# Patient Record
Sex: Male | Born: 1980 | Race: Black or African American | Hispanic: No | Marital: Single | Smoking: Former smoker
Health system: Southern US, Community
[De-identification: ages and names within clinical notes are randomized; demographics above are authoritative.]

## PROBLEM LIST (undated history)

## (undated) ENCOUNTER — Emergency Department: Payer: No Typology Code available for payment source | Source: Home / Self Care

---

## 2011-06-17 ENCOUNTER — Emergency Department (HOSPITAL_COMMUNITY): Payer: PRIVATE HEALTH INSURANCE

## 2011-06-17 ENCOUNTER — Emergency Department (HOSPITAL_COMMUNITY)
Admission: EM | Admit: 2011-06-17 | Discharge: 2011-06-17 | Disposition: A | Discharge status: Home / Self Care | Payer: PRIVATE HEALTH INSURANCE | Attending: Emergency Medicine | Admitting: Emergency Medicine

## 2011-06-17 DIAGNOSIS — S060X9A Concussion with loss of consciousness of unspecified duration, initial encounter: Secondary | ICD-10-CM | POA: Insufficient documentation

## 2011-06-17 DIAGNOSIS — M25569 Pain in unspecified knee: Secondary | ICD-10-CM | POA: Insufficient documentation

## 2011-06-17 DIAGNOSIS — R0789 Other chest pain: Secondary | ICD-10-CM | POA: Insufficient documentation

## 2011-06-17 DIAGNOSIS — IMO0002 Reserved for concepts with insufficient information to code with codable children: Secondary | ICD-10-CM | POA: Insufficient documentation

## 2011-06-17 DIAGNOSIS — M542 Cervicalgia: Secondary | ICD-10-CM | POA: Insufficient documentation

## 2011-06-17 LAB — CBC
Hemoglobin: 15.6 g/dL (ref 13.0–17.0)
Platelets: 254 10*3/uL (ref 150–400)
RBC: 4.84 MIL/uL (ref 4.22–5.81)
WBC: 3 10*3/uL — ABNORMAL LOW (ref 4.0–10.5)

## 2011-06-17 LAB — COMPREHENSIVE METABOLIC PANEL
ALT: 12 U/L (ref 0–53)
AST: 28 U/L (ref 0–37)
Alkaline Phosphatase: 81 U/L (ref 39–117)
CO2: 22 mEq/L (ref 19–32)
Calcium: 9.4 mg/dL (ref 8.4–10.5)
Chloride: 104 mEq/L (ref 96–112)
GFR calc Af Amer: >90 mL/min (ref >90)
GFR calc non Af Amer: >90 mL/min (ref >90)
Glucose, Bld: 104 mg/dL — ABNORMAL HIGH (ref 70–99)
Potassium: 3.3 mEq/L — ABNORMAL LOW (ref 3.5–5.1)
Sodium: 139 mEq/L (ref 135–145)

## 2011-06-17 LAB — DIFFERENTIAL
Basophils Absolute: 0 10*3/uL (ref 0.0–0.1)
Eosinophils Absolute: 0.2 10*3/uL (ref 0.0–0.7)
Lymphocytes Relative: 55 % — ABNORMAL HIGH (ref 12–46)
Monocytes Relative: 13 % — ABNORMAL HIGH (ref 3–12)
Neutro Abs: 0.8 10*3/uL — ABNORMAL LOW (ref 1.7–7.7)
Neutrophils Relative %: 26 % — ABNORMAL LOW (ref 43–77)

## 2012-04-28 ENCOUNTER — Encounter (HOSPITAL_COMMUNITY): Payer: Self-pay | Admitting: Adult Health

## 2012-04-28 ENCOUNTER — Emergency Department (HOSPITAL_COMMUNITY)
Admission: EM | Admit: 2012-04-28 | Discharge: 2012-04-28 | Disposition: A | Discharge status: Home / Self Care | Payer: PRIVATE HEALTH INSURANCE | Attending: Emergency Medicine | Admitting: Emergency Medicine

## 2012-04-28 ENCOUNTER — Emergency Department (HOSPITAL_COMMUNITY): Payer: PRIVATE HEALTH INSURANCE

## 2012-04-28 DIAGNOSIS — F17200 Nicotine dependence, unspecified, uncomplicated: Secondary | ICD-10-CM | POA: Insufficient documentation

## 2012-04-28 DIAGNOSIS — S0180XA Unspecified open wound of other part of head, initial encounter: Secondary | ICD-10-CM | POA: Insufficient documentation

## 2012-04-28 DIAGNOSIS — S0083XA Contusion of other part of head, initial encounter: Secondary | ICD-10-CM

## 2012-04-28 DIAGNOSIS — S0181XA Laceration without foreign body of other part of head, initial encounter: Secondary | ICD-10-CM

## 2012-04-28 MED ORDER — NAPROXEN 500 MG PO TABS: 500.0000 mg | ORAL_TABLET | Freq: Two times a day (BID) | ORAL | Status: AC
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
## 2012-04-28 NOTE — ED Notes (Signed)
Pt came to RN first desk and reports he wanted to go; I advised pt of risks of leaving and benefits of staying; pt reports he will stay a little bit longer
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
## 2014-10-03 ENCOUNTER — Ambulatory Visit (INDEPENDENT_AMBULATORY_CARE_PROVIDER_SITE_OTHER): Payer: 59 | Admitting: Family Medicine

## 2014-10-03 VITALS — BP 128/76 | HR 75 | Temp 99.1°F | Resp 16 | Ht 74.0 in | Wt 204.0 lb

## 2014-10-03 DIAGNOSIS — R0789 Other chest pain: Secondary | ICD-10-CM

## 2014-10-03 DIAGNOSIS — A630 Anogenital (venereal) warts: Secondary | ICD-10-CM

## 2014-10-03 DIAGNOSIS — B079 Viral wart, unspecified: Secondary | ICD-10-CM

## 2014-10-03 DIAGNOSIS — Z202 Contact with and (suspected) exposure to infections with a predominantly sexual mode of transmission: Secondary | ICD-10-CM

## 2014-10-03 DIAGNOSIS — Z0000 Encounter for general adult medical examination without abnormal findings: Secondary | ICD-10-CM

## 2014-10-03 DIAGNOSIS — Z8042 Family history of malignant neoplasm of prostate: Secondary | ICD-10-CM

## 2014-10-03 LAB — POCT CBC
Granulocyte percent: 46.7 %G (ref 37–80)
HCT, POC: 48.7 % (ref 43.5–53.7)
HEMOGLOBIN: 16.1 g/dL (ref 14.1–18.1)
Lymph, poc: 1.5 (ref 0.6–3.4)
MCH: 31.3 pg — AB (ref 27–31.2)
MCHC: 33 g/dL (ref 31.8–35.4)
MCV: 94.9 fL (ref 80–97)
MID (CBC): 0.2 (ref 0–0.9)
MPV: 8.5 fL (ref 0–99.8)
PLATELET COUNT, POC: 285 10*3/uL (ref 142–424)
POC Granulocyte: 1.4 — AB (ref 2–6.9)
POC LYMPH PERCENT: 48.2 %L (ref 10–50)
POC MID %: 5.1 % (ref 0–12)
RBC: 5.13 M/uL (ref 4.69–6.13)
RDW, POC: 14.4 %
WBC: 3.1 10*3/uL — AB (ref 4.6–10.2)

## 2014-10-03 LAB — COMPREHENSIVE METABOLIC PANEL
ALBUMIN: 4.2 g/dL (ref 3.5–5.2)
ALK PHOS: 67 U/L (ref 39–117)
ALT: 23 U/L (ref 0–53)
AST: 20 U/L (ref 0–37)
BILIRUBIN TOTAL: 0.9 mg/dL (ref 0.2–1.2)
BUN: 16 mg/dL (ref 6–23)
CO2: 27 meq/L (ref 19–32)
Calcium: 9.2 mg/dL (ref 8.4–10.5)
Chloride: 102 mEq/L (ref 96–112)
Creat: 0.98 mg/dL (ref 0.50–1.35)
GLUCOSE: 89 mg/dL (ref 70–99)
POTASSIUM: 4.2 meq/L (ref 3.5–5.3)
Sodium: 138 mEq/L (ref 135–145)
TOTAL PROTEIN: 7.4 g/dL (ref 6.0–8.3)

## 2014-10-03 LAB — LIPID PANEL
CHOLESTEROL: 159 mg/dL (ref 0–200)
HDL: 45 mg/dL (ref >39)
LDL CALC: 95 mg/dL (ref 0–99)
Total CHOL/HDL Ratio: 3.5 Ratio
Triglycerides: 94 mg/dL (ref <150)
VLDL: 19 mg/dL (ref 0–40)

## 2014-10-03 LAB — HIV ANTIBODY (ROUTINE TESTING W REFLEX): HIV: NONREACTIVE

## 2014-10-03 LAB — RPR

## 2014-10-03 NOTE — Progress Notes (Signed)
Physical exam:  History: 34 year old man who is here for his physical examination. He has never had a good checkup, and felt like it was time to do so. He is otherwise not having any major acute complaints.  Past medical history: Operations: None Medical illnesses: None. He did have malaria when he was a child in [place]. Medications: None Allergies: None known  Family history: Father is deceased from prostate cancer in his late 3150s. Mother is living with diabetes and hyperlipidemia  Social history: Patient is single, has been in the [place] for 7 or 8 years. He was last back in [place] 3 years ago. He is planning to go back and get married in one year. He does not smoke any longer, having quit a month ago. He also quit drinking. He does still dip snuff. 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  Review of systems: Constitutional: Unremarkable Dermatologic: Has genital warts, perianal warts, and a wart on his left shin. He also has an eczematoid rash on his abdomen. HEENT: Has had some nasal polypoid lesions Respiratory: Unremarkable Cardiovascular: Occasional substernal chest pains, nonspecific Gastrointestinal: Unremarkable except for occasional hemorrhoid Genitourinary: Unremarkable except for the genital warts as noted above Musculoskeletal mild left shoulder pains Neurologic: Unremarkable Psychiatric: Unremarkable Endocrine: Unremarkable  Physical examination Healthy-appearing young man in no acute distress. TMs normal. Eyes PERRLA. Fundi benign. Throat clear. Teeth stained. Neck supple without significant nodes. Chest is clear to auscultation. No thyromegaly Heart regular without murmurs gallops or arrhythmias. Abdomen soft without masses tenderness. Normal male external genitalia. Has some old scarred warts on his scrotum. Extremities unremarkable. Skin has a fairly prominent wart on his left side of his beard on the chin. 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 Pulses good.  Assessment: Physical examination Nonspecific chest pains Genital warts Wart on chin Mild left shoulder pains History of probable hemorrhoids  Plan: Patient appears healthy. Will check basic blood work including STD testing. Referred to a dermatologist for evaluation of the warts in the groin area. Will go ahead and do a cryo-procedure on the place on his chin. Check an EKG because of the chest pains although they sound fairly nonspecific.   EKG normal  Results for orders placed or performed in visit on 10/03/14  POCT CBC  Result Value Ref Range   WBC 3.1 (A) 4.6 - 10.2 K/uL   Lymph, poc 1.5 0.6 - 3.4   POC LYMPH PERCENT 48.2 10 - 50 %L   MID (cbc) 0.2 0 - 0.9   POC MID % 5.1 0 - 12 %M   POC Granulocyte 1.4 (A) 2 - 6.9   Granulocyte percent 46.7 37 - 80 %G   RBC 5.13 4.69 - 6.13 M/uL   Hemoglobin 16.1 14.1 - 18.1 g/dL   HCT, POC 16.148.7 09.643.5 - 53.7 %   MCV 94.9 80 - 97 fL   MCH, POC 31.3 (A) 27 - 31.2 pg   MCHC 33.0 31.8 - 35.4 g/dL   RDW, POC 04.514.4 %   Platelet Count, POC 285.0 142 - 424 K/uL   MPV 8.5 0 - 99.8 fL   Procedure note He actually has multiple warts on his chin. He has the large one and then a half dozen small ones. These were treated with cryosurgery free stall refreeze technique. He tolerated this well.

## 2014-10-03 NOTE — Patient Instructions (Addendum)
The wart on the chin should fall off in a week or 2. If it continues to persist or comes back please return  A referral is being made to a dermatologist (skin specialist). Someone will contact you when that appointment gets made.  Return as necessary.

## 2014-10-04 LAB — PSA: PSA: 0.63 ng/mL (ref <=4.00)

## 2014-10-06 LAB — GC/CHLAMYDIA PROBE AMP
CT Probe RNA: NEGATIVE
GC Probe RNA: NEGATIVE

## 2014-10-09 ENCOUNTER — Telehealth: Payer: Self-pay

## 2014-10-09 NOTE — Telephone Encounter (Signed)
Pt called about labs. Let him know they were all normal and that a letter was sent
